# Patient Record
Sex: Female | Born: 1970 | Race: White | Hispanic: No | Marital: Married | Smoking: Current every day smoker
Health system: Southern US, Community
[De-identification: ages and names within clinical notes are randomized; demographics above are authoritative.]

## PROBLEM LIST (undated history)

## (undated) DIAGNOSIS — E119 Type 2 diabetes mellitus without complications: Secondary | ICD-10-CM

## (undated) DIAGNOSIS — F32A Depression, unspecified: Secondary | ICD-10-CM

## (undated) DIAGNOSIS — I10 Essential (primary) hypertension: Secondary | ICD-10-CM

## (undated) DIAGNOSIS — F329 Major depressive disorder, single episode, unspecified: Secondary | ICD-10-CM

## (undated) DIAGNOSIS — E079 Disorder of thyroid, unspecified: Secondary | ICD-10-CM

## (undated) HISTORY — PX: FOOT SURGERY: SHX648

## (undated) HISTORY — PX: ABDOMINAL HYSTERECTOMY: SHX81

## (undated) HISTORY — PX: HAND SURGERY: SHX662

---

## 2015-09-20 ENCOUNTER — Ambulatory Visit
Admission: EM | Admit: 2015-09-20 | Discharge: 2015-09-20 | Disposition: A | Discharge status: Home / Self Care | Payer: Worker's Compensation | Attending: Family Medicine | Admitting: Family Medicine

## 2015-09-20 ENCOUNTER — Ambulatory Visit: Payer: Worker's Compensation

## 2015-09-20 DIAGNOSIS — M5442 Lumbago with sciatica, left side: Secondary | ICD-10-CM

## 2015-09-20 DIAGNOSIS — M479 Spondylosis, unspecified: Secondary | ICD-10-CM

## 2015-09-20 DIAGNOSIS — R252 Cramp and spasm: Secondary | ICD-10-CM

## 2015-09-20 HISTORY — DX: Disorder of thyroid, unspecified: E07.9

## 2015-09-20 HISTORY — DX: Type 2 diabetes mellitus without complications: E11.9

## 2015-09-20 HISTORY — DX: Essential (primary) hypertension: I10

## 2015-09-20 HISTORY — DX: Major depressive disorder, single episode, unspecified: F32.9

## 2015-09-20 HISTORY — DX: Depression, unspecified: F32.A

## 2015-09-20 MED ORDER — PREDNISONE 10 MG (21) PO TBPK: ORAL_TABLET | ORAL | Status: AC

## 2015-09-20 MED ORDER — MELOXICAM 15 MG PO TABS: 15.0000 mg | ORAL_TABLET | Freq: Every day | ORAL | Status: DC

## 2015-09-20 MED ORDER — HYDROCODONE-ACETAMINOPHEN 5-325 MG PO TABS: 1.0000 | ORAL_TABLET | Freq: Three times a day (TID) | ORAL | Status: AC | PRN

## 2015-09-20 MED ORDER — ORPHENADRINE CITRATE ER 100 MG PO TB12: 100.0000 mg | ORAL_TABLET | Freq: Two times a day (BID) | ORAL | Status: DC

## 2015-09-20 NOTE — ED Provider Notes (Signed)
CSN: [id number]     Arrival date & time 09/20/15  1041 History   First MD Initiated Contact with Patient 09/20/15 1141    Nurses notes were reviewed. Chief Complaint  Patient presents with  . Fall  . Back Pain   patient states that she fell on 09/07/2015 out of her chair while she was trying to unplug something underneath her desk. She and on her left hand and wrist versus gotten better but about 6-7 days after this fall she started having back pain. She states she's never had back pain before. The pain is progressively gotten worse interfering with her walking and her ambulation as well. States pain goes down her left thigh and shooting sensation. (Consider location/radiation/quality/duration/timing/severity/associated sxs/prior Treatment) Patient is a 44 y.o. female presenting with fall and back pain. The history is provided by the patient. No language interpreter was used.  Fall This is a new problem. The current episode started more than 1 week ago Larey Seat(Fell on 09/07/2015 at work chair went out from under her as she was reaching to work on a plug under her desk.). The problem has been gradually worsening. Pertinent negatives include no chest pain, no abdominal pain, no headaches and no shortness of breath. The symptoms are aggravated by bending and walking. Nothing relieves the symptoms. She has tried nothing for the symptoms.  Back Pain Location:  Lumbar spine and sacro-iliac joint Quality:  Aching and stabbing Radiates to:  L posterior upper leg and L thigh Pain severity:  Moderate Onset quality:  Sudden Timing:  Constant Progression:  Worsening Chronicity:  New Context: falling   Context: not emotional stress, not jumping from heights, not lifting heavy objects, not MCA, not MVA, not occupational injury, not pedestrian accident, not physical stress and not recent illness   Relieved by:  Nothing Ineffective treatments:  None tried Associated symptoms: no abdominal pain, no chest pain and no  headaches   Risk factors: recent surgery   Risk factors: no hx of cancer     Past Medical History  Diagnosis Date  . Hypertension   . Diabetes mellitus without complication (HCC)   . Thyroid disease   . Depression    Past Surgical History  Procedure Laterality Date  . Abdominal hysterectomy    . Hand surgery    . Foot surgery     History reviewed. No pertinent family history. Social History  Substance Use Topics  . Smoking status: Current Every Day Smoker  . Smokeless tobacco: None  . Alcohol Use: Yes   OB History    No data available     Review of Systems  Respiratory: Negative for shortness of breath.   Cardiovascular: Negative for chest pain.  Gastrointestinal: Negative for abdominal pain.  Musculoskeletal: Positive for back pain.  Neurological: Negative for headaches.  All other systems reviewed and are negative.  Patient does smoke and requesting medication for pain Allergies  Penicillins; Sulfur; and Tramadol  Home Medications   Prior to Admission medications   Medication Sig Start Date End Date Taking? Authorizing Provider  aspirin 81 MG chewable tablet Chew by mouth daily.   Yes Historical Provider, MD  buPROPion (ZYBAN) 150 MG 12 hr tablet Take 150 mg by mouth 2 (two) times daily.   Yes Historical Provider, MD  citalopram (CELEXA) 40 MG tablet Take 40 mg by mouth daily.   Yes Historical Provider, MD  glyBURIDE (DIABETA) 5 MG tablet Take 5 mg by mouth daily with breakfast.   Yes Historical Provider,  MD  levothyroxine (SYNTHROID, LEVOTHROID) 150 MCG tablet Take 150 mcg by mouth daily before breakfast.   Yes Historical Provider, MD  Liraglutide (VICTOZA [place]) Inject 1.2 mg into the skin 1 day or 1 dose.   Yes Historical Provider, MD  lisinopril (PRINIVIL,ZESTRIL) 2.5 MG tablet Take 2.5 mg by mouth daily.   Yes Historical Provider, MD  meclizine (ANTIVERT) 25 MG tablet Take 25 mg by mouth 3 (three) times daily as needed for dizziness.   Yes Historical Provider,  MD  metFORMIN (GLUCOPHAGE) 500 MG tablet Take by mouth 2 (two) times daily with a meal.   Yes Historical Provider, MD  simvastatin (ZOCOR) 20 MG tablet Take 20 mg by mouth daily.   Yes Historical Provider, MD  traZODone (DESYREL) 150 MG tablet Take by mouth at bedtime.   Yes Historical Provider, MD  HYDROcodone-acetaminophen (NORCO) 5-325 MG tablet Take 1 tablet by mouth every 8 (eight) hours as needed for moderate pain. 09/20/15   Hassan Rowan, MD  meloxicam (MOBIC) 15 MG tablet Take 1 tablet (15 mg total) by mouth daily. 09/20/15   Hassan Rowan, MD  orphenadrine (NORFLEX) 100 MG tablet Take 1 tablet (100 mg total) by mouth 2 (two) times daily. 09/20/15   Hassan Rowan, MD  predniSONE (STERAPRED UNI-PAK 21 TAB) 10 MG (21) TBPK tablet Sig 6 tablet day 1, 5 tablets day 2, 4 tablets day 3,,3tablets day 4, 2 tablets day 5, 1 tablet day 6 take all tablets orally 09/20/15   Hassan Rowan, MD   Meds Ordered and Administered this Visit  Medications - No data to display  BP 118/62 mmHg  Pulse 69  Temp(Src) 98.6 F (37 C) (Tympanic)  Resp 16  Ht  (1.676 m)  Wt 240 lb (108.863 kg)  BMI 38.76 kg/m2  SpO2 100% No data found.   Physical Exam  Constitutional: She appears well-developed and well-nourished.  HENT:  Head: Normocephalic and atraumatic.  Eyes: Conjunctivae are normal. Pupils are equal, round, and reactive to light.  Neck: Normal range of motion. Neck supple.  Musculoskeletal: She exhibits tenderness.       Lumbar back: She exhibits tenderness and spasm.       Back:  Patient pain over the left iliac sacral crest and over the left lower lumbar spine.  Neurological: She is alert. She has normal strength. No cranial nerve deficit or sensory deficit. She exhibits normal muscle tone.  Reflex Scores:      Patellar reflexes are 2+ on the right side and 2+ on the left side.      Achilles reflexes are 2+ on the right side and 2+ on the left side. Skin: Skin is warm and dry. No erythema.    Psychiatric: She has a normal mood and affect. Her behavior is normal.  Vitals reviewed.   ED Course  Procedures (including critical care time)  Labs Review Labs Reviewed - No data to display  Imaging Review Dg Lumbar Spine Complete  09/20/2015  CLINICAL DATA:  Right-sided low back pain, fell 2 weeks ago EXAM: LUMBAR SPINE - COMPLETE 4+ VIEW COMPARISON:  None. FINDINGS: The lumbar vertebrae are normal alignment. Intervertebral disc spaces appear normal. No compression deformity is seen. On oblique views the facet joints are unremarkable with minimal degenerative change on the right at L4-5. The SI joints are corticated. The bowel gas pattern is nonspecific. IMPRESSION: 1. Normal alignment with normal disc spaces. 2. Minimal degenerative change of the right facet joint at L4-5. Electronically Signed  By: Dwyane Dee M.D.   On: 09/20/2015 12:59   Dg Si Joints  09/20/2015  CLINICAL DATA:  Right-sided low back pain, fell several weeks ago EXAM: BILATERAL SACROILIAC JOINTS - 3+ VIEW COMPARISON:  None. FINDINGS: The SI joints appear well corticated with no diastasis noted. The sacral foramina appear normal. No sacral fracture is seen. The pelvic rami are intact. There is synchondrosis of the left transverse process of L5 with the sacrum. IMPRESSION: Negative. Electronically Signed   By: Dwyane Dee M.D.   On: 09/20/2015 13:01     Visual Acuity Review  Right Eye Distance:   Left Eye Distance:   Bilateral Distance:    Right Eye Near:   Left Eye Near:    Bilateral Near:         MDM   1. Muscle cramps   2. 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 Will limit her to 15  pound weight limit and lifting and carrying objects at work and will follow up with her in 2 weeks for reevaluation.   Hassan Rowan, MD 09/20/15 1325

## 2015-09-20 NOTE — ED Notes (Signed)
Pt states "I fell out of my chair at work on 09/07/15. It's been about 2 weeks but the pain in my back started last week. I have taken aleve without relief."

## 2015-09-20 NOTE — Discharge Instructions (Signed)
Back Exercises °If you have pain in your back, do these exercises 2-3 times each day or as told by your doctor. When the pain goes away, do the exercises once each day, but repeat the steps more times for each exercise (do more repetitions). If you do not have pain in your back, do these exercises once each day or as told by your doctor. °EXERCISES °Single Knee to Chest °Do these steps 3-5 times in a row for each leg: °· Lie on your back on a firm bed or the floor with your legs stretched out. °· Bring one knee to your chest. °· Hold your knee to your chest by grabbing your knee or thigh. °· Pull on your knee until you feel a gentle stretch in your lower back. °· Keep doing the stretch for 10-30 seconds. °· Slowly let go of your leg and straighten it. °Pelvic Tilt °Do these steps 5-10 times in a row: °· Lie on your back on a firm bed or the floor with your legs stretched out. °· Bend your knees so they point up to the ceiling. Your feet should be flat on the floor. °· Tighten your lower belly (abdomen) muscles to press your lower back against the floor. This will make your tailbone point up to the ceiling instead of pointing down to your feet or the floor. °· Stay in this position for 5-10 seconds while you gently tighten your muscles and breathe evenly. °Cat-Cow °Do these steps until your lower back bends more easily: °· Get on your hands and knees on a firm surface. Keep your hands under your shoulders, and keep your knees under your hips. You may put padding under your knees. °· Let your head hang down, and make your tailbone point down to the floor so your lower back is round like the back of a cat. °· Stay in this position for 5 seconds. °· Slowly lift your head and make your tailbone point up to the ceiling so your back hangs low (sags) like the back of a cow. °· Stay in this position for 5 seconds. °Press-Ups °Do these steps 5-10 times in a row: °1. Lie on your belly (face-down) on the floor. °2. Place your  hands near your head, about shoulder-width apart. °3. While you keep your back relaxed and keep your hips on the floor, slowly straighten your arms to raise the top half of your body and lift your shoulders. Do not use your back muscles. To make yourself more comfortable, you may change where you place your hands. °4. Stay in this position for 5 seconds. °5. Slowly return to lying flat on the floor. °Bridges °Do these steps 10 times in a row: °1. Lie on your back on a firm surface. °2. Bend your knees so they point up to the ceiling. Your feet should be flat on the floor. °3. Tighten your butt muscles and lift your butt off of the floor until your waist is almost as high as your knees. If you do not feel the muscles working in your butt and the back of your thighs, slide your feet 1-2 inches farther away from your butt. °4. Stay in this position for 3-5 seconds. °5. Slowly lower your butt to the floor, and let your butt muscles relax. °If this exercise is too easy, try doing it with your arms crossed over your chest. °Belly Crunches °Do these steps 5-10 times in a row: °1. Lie on your back on a firm bed   or the floor with your legs stretched out. °2. Bend your knees so they point up to the ceiling. Your feet should be flat on the floor. °3. Cross your arms over your chest. °4. Tip your chin a little bit toward your chest but do not bend your neck. °5. Tighten your belly muscles and slowly raise your chest just enough to lift your shoulder blades a tiny bit off of the floor. °6. Slowly lower your chest and your head to the floor. °Back Lifts °Do these steps 5-10 times in a row: °1. Lie on your belly (face-down) with your arms at your sides, and rest your forehead on the floor. °2. Tighten the muscles in your legs and your butt. °3. Slowly lift your chest off of the floor while you keep your hips on the floor. Keep the back of your head in line with the curve in your back. Look at the floor while you do this. °4. Stay  in this position for 3-5 seconds. °5. Slowly lower your chest and your face to the floor. °GET HELP IF: °· Your back pain gets a lot worse when you do an exercise. °· Your back pain does not lessen 2 hours after you exercise. °If you have any of these problems, stop doing the exercises. Do not do them again unless your doctor says it is okay. °GET HELP RIGHT AWAY IF: °· You have sudden, very bad back pain. If this happens, stop doing the exercises. Do not do them again unless your doctor says it is okay. °  °This information is not intended to replace advice given to you by your health care provider. Make sure you discuss any questions you have with your health care provider. °  °Document Released: 12/27/2010 Document Revised: 08/15/2015 Document Reviewed: 01/18/2015 °[employer]vier Interactive Patient Education ©2016 [employer] ° °Muscle Cramps and Spasms °Muscle cramps and spasms are when muscles tighten by themselves. They usually get better within minutes. Muscle cramps are painful. They are usually stronger and last longer than muscle spasms. Muscle spasms may or may not be painful. They can last a few seconds or much longer. °HOME CARE °· Drink enough fluid to keep your pee (urine) clear or pale yellow. °· Massage, stretch, and relax the muscle. °· Use a warm towel, heating pad, or warm shower water on tight muscles. °· Place ice on the muscle if it is tender or in pain. °· Put ice in a plastic bag. °· Place a towel between your skin and the bag. °· Leave the ice on for 15-20 minutes, 03-04 times a day. °· Only take medicine as told by your doctor. °GET HELP RIGHT AWAY IF:  °Your cramps or spasms get worse, happen more often, or do not get better with time. °MAKE SURE YOU: °· Understand these instructions. °· Will watch your condition. °· Will get help right away if you are not doing well or get worse. °  °This information is not intended to replace advice given to you by your health care provider. Make sure you  discuss any questions you have with your health care provider. °  °Document Released: 11/06/2008 Document Revised: 03/21/2013 Document Reviewed: 11/10/2012 °[employer]vier Interactive Patient Education ©2016 [employer] ° °Sciatica °Sciatica is pain, weakness, numbness, or tingling along your sciatic nerve. The nerve starts in the lower back and runs down the back of each leg. Nerve damage or certain conditions pinch or put pressure on the sciatic nerve. This causes the pain, weakness, and   other discomforts of sciatica. °HOME CARE  °· Only take medicine as told by your doctor. °· Apply ice to the affected area for 20 minutes. Do this 3-4 times a day for the first 48-72 hours. Then try heat in the same way. °· Exercise, stretch, or do your usual activities if these do not make your pain worse. °· Go to physical therapy as told by your doctor. °· Keep all doctor visits as told. °· Do not wear high heels or shoes that are not supportive. °· Get a firm mattress if your mattress is too soft to lessen pain and discomfort. °GET HELP RIGHT AWAY IF:  °· You cannot control when you poop (bowel movement) or pee (urinate). °· You have more weakness in your lower back, lower belly (pelvis), butt (buttocks), or legs. °· You have redness or puffiness (swelling) of your back. °· You have a burning feeling when you pee. °· You have pain that gets worse when you lie down. °· You have pain that wakes you from your sleep. °· Your pain is worse than past pain. °· Your pain lasts longer than 4 weeks. °· You are suddenly losing weight without reason. °MAKE SURE YOU:  °· Understand these instructions. °· Will watch this condition. °· Will get help right away if you are not doing well or get worse. °  °This information is not intended to replace advice given to you by your health care provider. Make sure you discuss any questions you have with your health care provider. °  °Document Released: 09/02/2008 Document Revised: 08/15/2015 Document  Reviewed: 04/04/2012 °[employer]vier Interactive Patient Education ©2016 [employer] ° °

## 2015-10-05 ENCOUNTER — Encounter: Payer: Self-pay | Admitting: Emergency Medicine

## 2015-10-05 ENCOUNTER — Ambulatory Visit
Admission: EM | Admit: 2015-10-05 | Discharge: 2015-10-05 | Disposition: A | Discharge status: Home / Self Care | Payer: Worker's Compensation | Attending: Family Medicine | Admitting: Family Medicine

## 2015-10-05 DIAGNOSIS — M5442 Lumbago with sciatica, left side: Secondary | ICD-10-CM

## 2015-10-05 DIAGNOSIS — M62830 Muscle spasm of back: Secondary | ICD-10-CM | POA: Diagnosis not present

## 2015-10-05 MED ORDER — ORPHENADRINE CITRATE ER 100 MG PO TB12: 100.0000 mg | ORAL_TABLET | Freq: Two times a day (BID) | ORAL | Status: AC

## 2015-10-05 MED ORDER — MELOXICAM 15 MG PO TABS: 15.0000 mg | ORAL_TABLET | Freq: Every day | ORAL | Status: AC

## 2015-10-05 NOTE — Discharge Instructions (Signed)
Muscle Cramps and Spasms Muscle cramps and spasms occur when a muscle or muscles tighten and you have no control over this tightening (involuntary muscle contraction). They are a common problem and can develop in any muscle. The most common place is in the calf muscles of the leg. Both muscle cramps and muscle spasms are involuntary muscle contractions, but they also have differences:   Muscle cramps are sporadic and painful. They may last a few seconds to a quarter of an hour. Muscle cramps are often more forceful and last longer than muscle spasms.  Muscle spasms may or may not be painful. They may also last just a few seconds or much longer. CAUSES  It is uncommon for cramps or spasms to be due to a serious underlying problem. In many cases, the cause of cramps or spasms is unknown. Some common causes are:   Overexertion.   Overuse from repetitive motions (doing the same thing over and over).   Remaining in a certain position for a long period of time.   Improper preparation, form, or technique while performing a sport or activity.   Dehydration.   Injury.   Side effects of some medicines.   Abnormally low levels of the salts and ions in your blood (electrolytes), especially potassium and calcium. This could happen if you are taking water pills (diuretics) or you are pregnant.  Some underlying medical problems can make it more likely to develop cramps or spasms. These include, but are not limited to:   Diabetes.   Parkinson disease.   Hormone disorders, such as thyroid problems.   Alcohol abuse.   Diseases specific to muscles, joints, and bones.   Blood vessel disease where not enough blood is getting to the muscles.  HOME CARE INSTRUCTIONS   Stay well hydrated. Drink enough water and fluids to keep your urine clear or pale yellow.  It may be helpful to massage, stretch, and relax the affected muscle.  For tight or tense muscles, use a warm towel, heating  pad, or hot shower water directed to the affected area.  If you are sore or have pain after a cramp or spasm, applying ice to the affected area may relieve discomfort.  Put ice in a plastic bag.  Place a towel between your skin and the bag.  Leave the ice on for 15-20 minutes, 03-04 times a day.  Medicines used to treat a known cause of cramps or spasms may help reduce their frequency or severity. Only take over-the-counter or prescription medicines as directed by your caregiver. SEEK MEDICAL CARE IF:  Your cramps or spasms get more severe, more frequent, or do not improve over time.  MAKE SURE YOU:   Understand these instructions.  Will watch your condition.  Will get help right away if you are not doing well or get worse.   This information is not intended to replace advice given to you by your health care provider. Make sure you discuss any questions you have with your health care provider.   Document Released: 05/16/2002 Document Revised: 03/21/2013 Document Reviewed: 11/10/2012 Elsevier Interactive Patient Education 2016 [place] Injury Prevention Back injuries can be very painful. They can also be difficult to heal. After having one back injury, you are more likely to injure your back again. It is important to learn how to avoid injuring or re-injuring your back. The following tips can help you to prevent a back injury. WHAT SHOULD I KNOW ABOUT PHYSICAL FITNESS?  Exercise for  30 minutes per day on most days of the week or as told by your doctor. Make sure to:  Do aerobic exercises, such as walking, jogging, biking, or swimming.  Do exercises that increase balance and strength, such as tai chi and yoga.  Do stretching exercises. This helps with flexibility.  Try to develop strong belly (abdominal) muscles. Your belly muscles help to support your back.  Stay at a healthy weight. This helps to decrease your risk of a back injury. WHAT SHOULD I KNOW ABOUT MY  DIET?  Talk with your doctor about your overall diet. Take supplements and vitamins only as told by your doctor.  Talk with your doctor about how much calcium and vitamin D you need each day. These nutrients help to prevent weakening of the bones (osteoporosis).  Include good sources of calcium in your diet, such as:  Dairy products.  Green leafy vegetables.  Products that have had calcium added to them (fortified).  Include good sources of vitamin D in your diet, such as:  Milk.  Foods that have had vitamin D added to them. WHAT SHOULD I KNOW ABOUT MY POSTURE?  Sit up straight and stand up straight. Avoid leaning forward when you sit or hunching over when you stand.  Choose chairs that have good low-back (lumbar) support.  If you work at a desk, sit close to it so you do not need to lean over. Keep your chin tucked in. Keep your neck drawn back. Keep your elbows bent so your arms look like the letter "L" (right angle).  Sit high and close to the steering wheel when you drive. Add a low-back support to your car seat, if needed.  Avoid sitting or standing in one position for very long. Take breaks to get up, stretch, and walk around at least one time every hour. Take breaks every hour if you are driving for long periods of time.  Sleep on your side with your knees slightly bent, or sleep on your back with a pillow under your knees. Do not lie on the front of your body to sleep. WHAT SHOULD I KNOW ABOUT LIFTING, TWISTING, AND REACHING Lifting and Heavy Lifting  Avoid heavy lifting, especially lifting over and over again. If you must do heavy lifting:  Stretch before lifting.  Work slowly.  Rest between lifts.  Use a tool such as a cart or a dolly to move objects if one is available.  Make several small trips instead of carrying one heavy load.  Ask for help when you need it, especially when moving big objects.  Follow these steps when lifting:  Stand with your feet  shoulder-width apart.  Get as close to the object as you can. Do not pick up a heavy object that is far from your body.  Use handles or lifting straps if they are available.  Bend at your knees. Squat down, but keep your heels off the floor.  Keep your shoulders back. Keep your chin tucked in. Keep your back straight.  Lift the object slowly while you tighten the muscles in your legs, belly, and butt. Keep the object as close to the center of your body as possible.  Follow these steps when putting down a heavy load:  Stand with your feet shoulder-width apart.  Lower the object slowly while you tighten the muscles in your legs, belly, and butt. Keep the object as close to the center of your body as possible.  Keep your shoulders back. Keep your  chin tucked in. Keep your back straight.  Bend at your knees. Squat down, but keep your heels off the floor.  Use handles or lifting straps if they are available. Twisting and Reaching  Avoid lifting heavy objects above your waist.  Do not twist at your waist while you are lifting or carrying a load. If you need to turn, move your feet.  Do not bend over without bending at your knees.  Avoid reaching over your head, across a table, or for an object on a high surface.  WHAT ARE SOME OTHER TIPS?  Avoid wet floors and icy ground. Keep sidewalks clear of ice to prevent falls.   Do not sleep on a mattress that is too soft or too hard.   Keep items that you use often within easy reach.   Put heavier objects on shelves at waist level, and put lighter objects on lower or higher shelves.  Find ways to lower your stress, such as:  Exercise.  Massage.  Relaxation techniques.  Talk with your doctor if you feel anxious or depressed. These conditions can make back pain worse.  Wear flat heel shoes with cushioned soles.  Avoid making quick (sudden) movements.  Use both shoulder straps when carrying a backpack.  Do not use any  tobacco products, including cigarettes, chewing tobacco, or electronic cigarettes. If you need help quitting, ask your doctor.   This information is not intended to replace advice given to you by your health care provider. Make sure you discuss any questions you have with your health care provider.   Document Released: 05/12/2008 Document Revised: 04/10/2015 Document Reviewed: 11/28/2014 Elsevier Interactive Patient Education [employer].

## 2015-10-05 NOTE — ED Provider Notes (Signed)
CSN: [id number]     Arrival date & time 10/05/15  1258 History   None   Nurses notes were reviewed. Chief Complaint  Patient presents with  . Back Pain  . Worker's Comp Foloow-up Visit    patient reports overall things doing much better. The pain and discomfort that she was having this pain was gone. She reports some pain when she stands cooking at home for long period or if she is moving some things at work but usually she is can pace herself and she does well. She states that the Mobic and Norflex and really felt back. (Consider location/radiation/quality/duration/timing/severity/associated sxs/prior Treatment) Patient is a 44 y.o. female presenting with back pain. The history is provided by the patient. No language interpreter was used.  Back Pain Location:  Sacro-iliac joint Quality:  Unable to specify Pain severity:  Mild Progression:  Improving Chronicity:  New Context: occupational injury   Context: not emotional stress, not jumping from heights, not lifting heavy objects, not pedestrian accident, not physical stress and not recent illness   Relieved by:  Muscle relaxants and NSAIDs Worsened by:  Twisting and standing Ineffective treatments:  NSAIDs Associated symptoms: no chest pain, no dysuria and no perianal numbness     Past Medical History  Diagnosis Date  . Hypertension   . Diabetes mellitus without complication (HCC)   . Thyroid disease   . Depression    Past Surgical History  Procedure Laterality Date  . Abdominal hysterectomy    . Hand surgery    . Foot surgery     History reviewed. No pertinent family history. Social History  Substance Use Topics  . Smoking status: Current Every Day Smoker  . Smokeless tobacco: None  . Alcohol Use: Yes   OB History    No data available     Review of Systems  Cardiovascular: Negative for chest pain.  Genitourinary: Negative for dysuria.  Musculoskeletal: Positive for back pain.  All other systems reviewed and are  negative.   Allergies  Penicillins; Sulfur; and Tramadol  Home Medications   Prior to Admission medications   Medication Sig Start Date End Date Taking? Authorizing Provider  aspirin 81 MG chewable tablet Chew by mouth daily.    Historical Provider, MD  buPROPion (ZYBAN) 150 MG 12 hr tablet Take 150 mg by mouth 2 (two) times daily.    Historical Provider, MD  citalopram (CELEXA) 40 MG tablet Take 40 mg by mouth daily.    Historical Provider, MD  glyBURIDE (DIABETA) 5 MG tablet Take 5 mg by mouth daily with breakfast.    Historical Provider, MD  HYDROcodone-acetaminophen (NORCO) 5-325 MG tablet Take 1 tablet by mouth every 8 (eight) hours as needed for moderate pain. 09/20/15   Hassan Rowan, MD  levothyroxine (SYNTHROID, LEVOTHROID) 150 MCG tablet Take 150 mcg by mouth daily before breakfast.    Historical Provider, MD  Liraglutide (VICTOZA [place]) Inject 1.2 mg into the skin 1 day or 1 dose.    Historical Provider, MD  lisinopril (PRINIVIL,ZESTRIL) 2.5 MG tablet Take 2.5 mg by mouth daily.    Historical Provider, MD  meclizine (ANTIVERT) 25 MG tablet Take 25 mg by mouth 3 (three) times daily as needed for dizziness.    Historical Provider, MD  meloxicam (MOBIC) 15 MG tablet Take 1 tablet (15 mg total) by mouth daily. 10/05/15   Hassan Rowan, MD  metFORMIN (GLUCOPHAGE) 500 MG tablet Take by mouth 2 (two) times daily with a meal.    Historical  Provider, MD  orphenadrine (NORFLEX) 100 MG tablet Take 1 tablet (100 mg total) by mouth 2 (two) times daily. 10/05/15   Hassan RowanEugene Fatumata Kashani, MD  predniSONE (STERAPRED UNI-PAK 21 TAB) 10 MG (21) TBPK tablet Sig 6 tablet day 1, 5 tablets day 2, 4 tablets day 3,,3tablets day 4, 2 tablets day 5, 1 tablet day 6 take all tablets orally 09/20/15   Hassan RowanEugene Jontavius Rabalais, MD  simvastatin (ZOCOR) 20 MG tablet Take 20 mg by mouth daily.    Historical Provider, MD  traZODone (DESYREL) 150 MG tablet Take by mouth at bedtime.    Historical Provider, MD   Meds Ordered and Administered this  Visit  Medications - No data to display  BP 117/56 mmHg  Pulse 77  Temp(Src) 97.4 F (36.3 C) (Tympanic)  Resp 16  Ht 5\' 7"  (1.702 m)  Wt 240 lb (108.863 kg)  BMI 37.58 kg/m2  SpO2 95% No data found.   Physical Exam  Constitutional: She is oriented to person, place, and time. She appears well-developed and well-nourished.  HENT:  Head: Normocephalic and atraumatic.  Eyes: Conjunctivae are normal. Pupils are equal, round, and reactive to light.  Musculoskeletal: Normal range of motion. She exhibits no edema or tenderness.  Neurological: She is alert and oriented to person, place, and time.  Skin: Skin is warm and dry.  Psychiatric: She has a normal mood and affect. Her behavior is normal.  Vitals reviewed.   ED Course  Procedures (including critical care time)  Labs Review Labs Reviewed - No data to display  Imaging Review No results found.   Visual Acuity Review  Right Eye Distance:   Left Eye Distance:   Bilateral Distance:    Right Eye Near:   Left Eye Near:    Bilateral Near:         MDM   1. Left-sided low back pain with left-sided sciatica   2. Muscle spasm of back      We will allow her to return full duty at this time return for office follow-up when necessary. Explained to her that she can open claim to my knowledge these 2 years after the incident without any trouble. Will renew her Mobic and Norflex to use if she needs it  Hassan RowanEugene Jeanette Moffatt, MD 10/05/15 [phone]

## 2015-10-05 NOTE — ED Notes (Signed)
Patient states that her back pain has improved and that occasionally when she is standing or walking fill a pain in her back and th pain will shoot down her leg.

## 2016-07-06 IMAGING — CR DG SI JOINTS 3+V
3 series · 3 of 3 positions shown · non-contrast
Comparison: None.

CLINICAL DATA: Right-sided low back pain, fell several weeks ago

EXAM:
BILATERAL SACROILIAC JOINTS - 3+ VIEW

[pelvis ap]
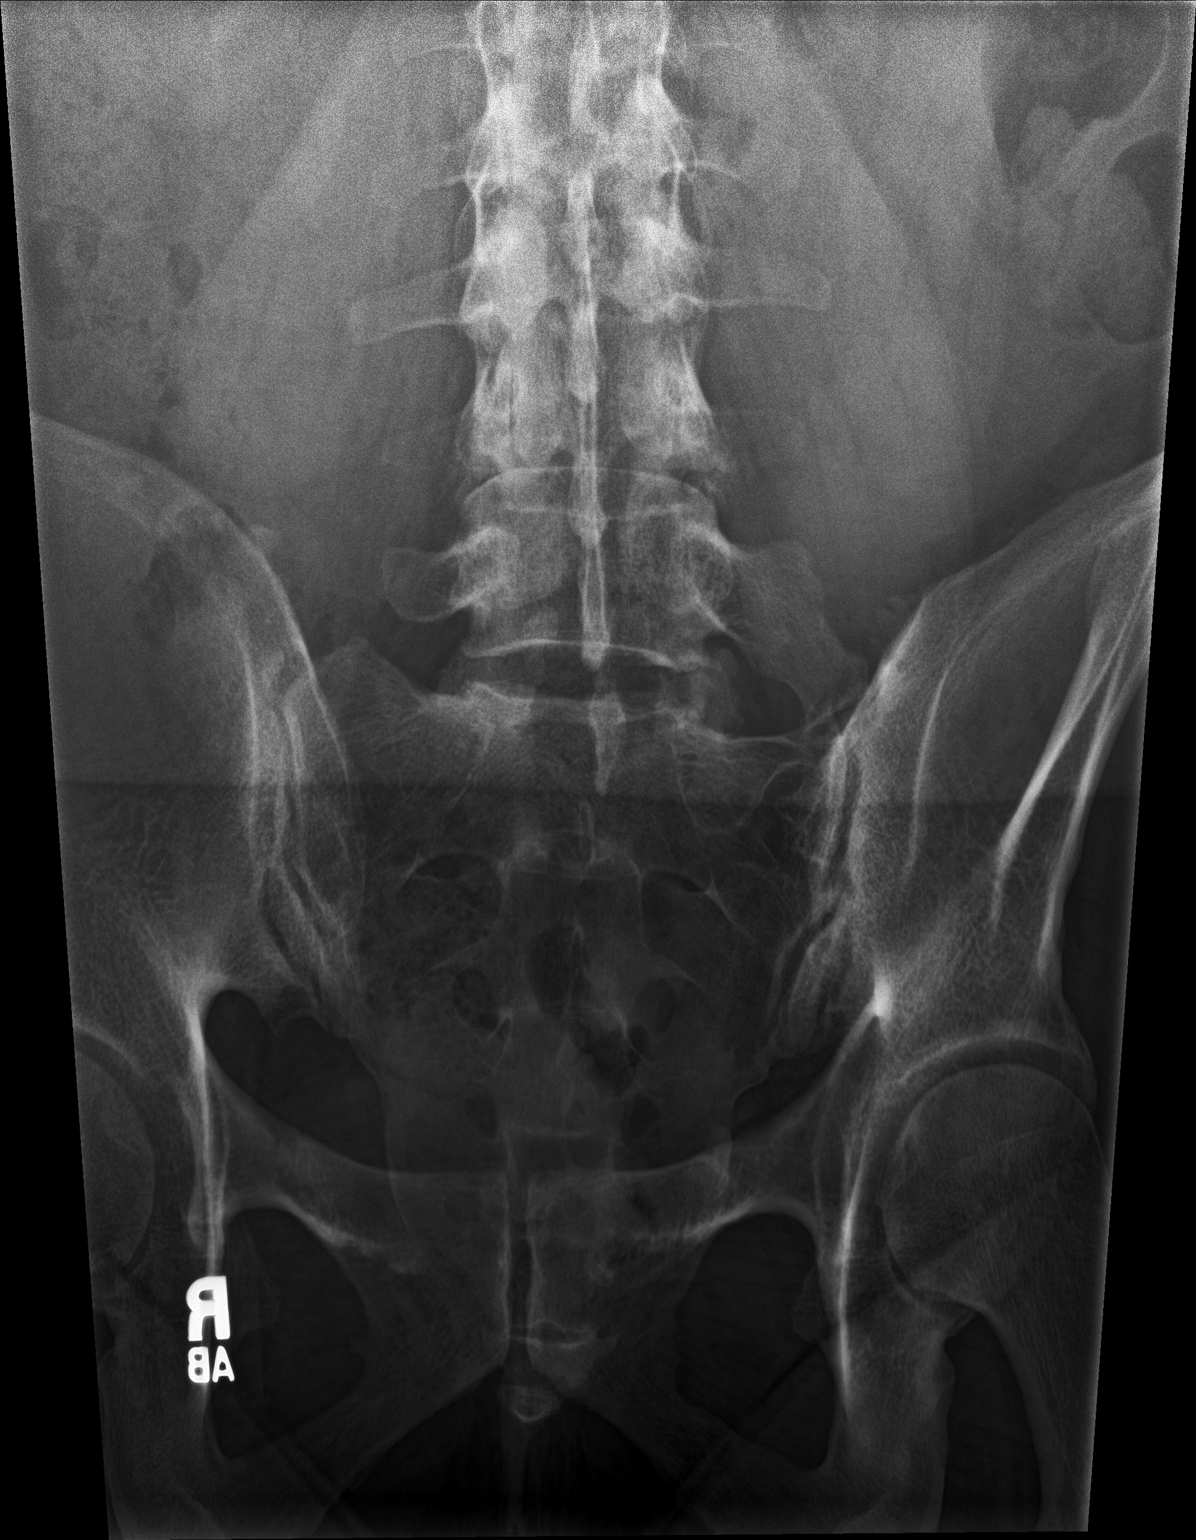

[si joints obli (1 of 2)]
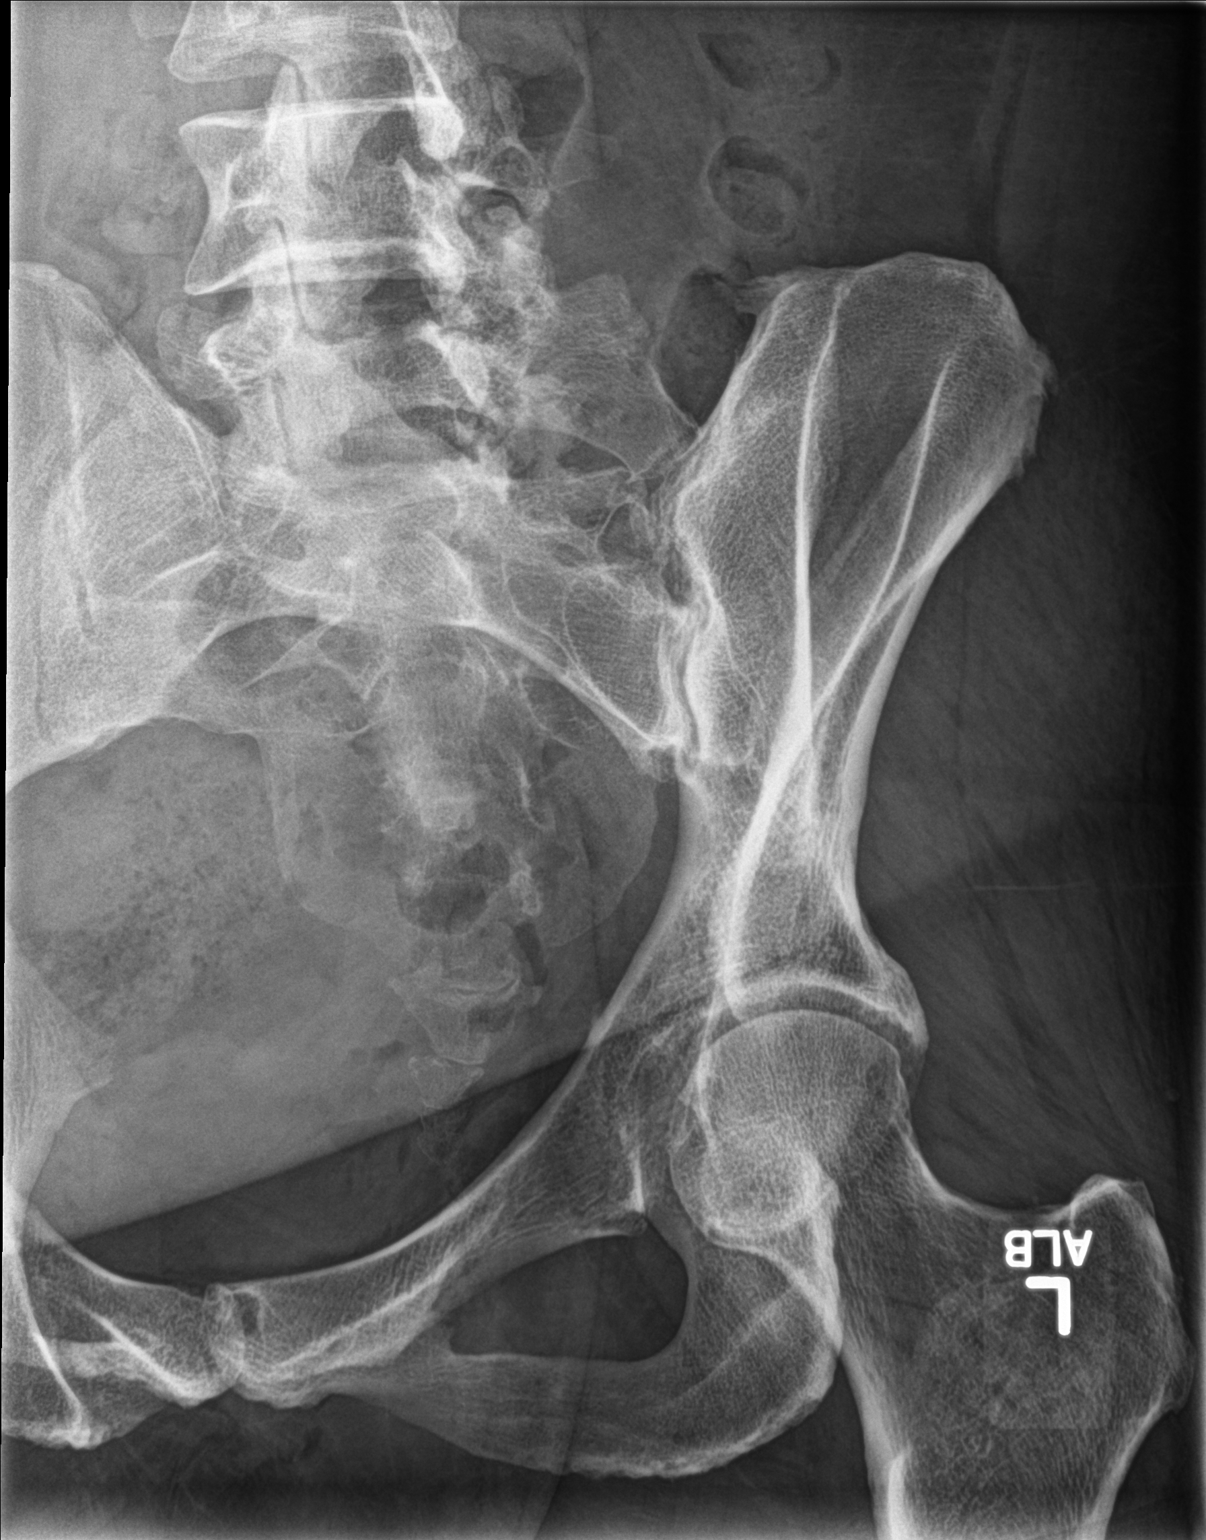

[si joints obli (2 of 2)]
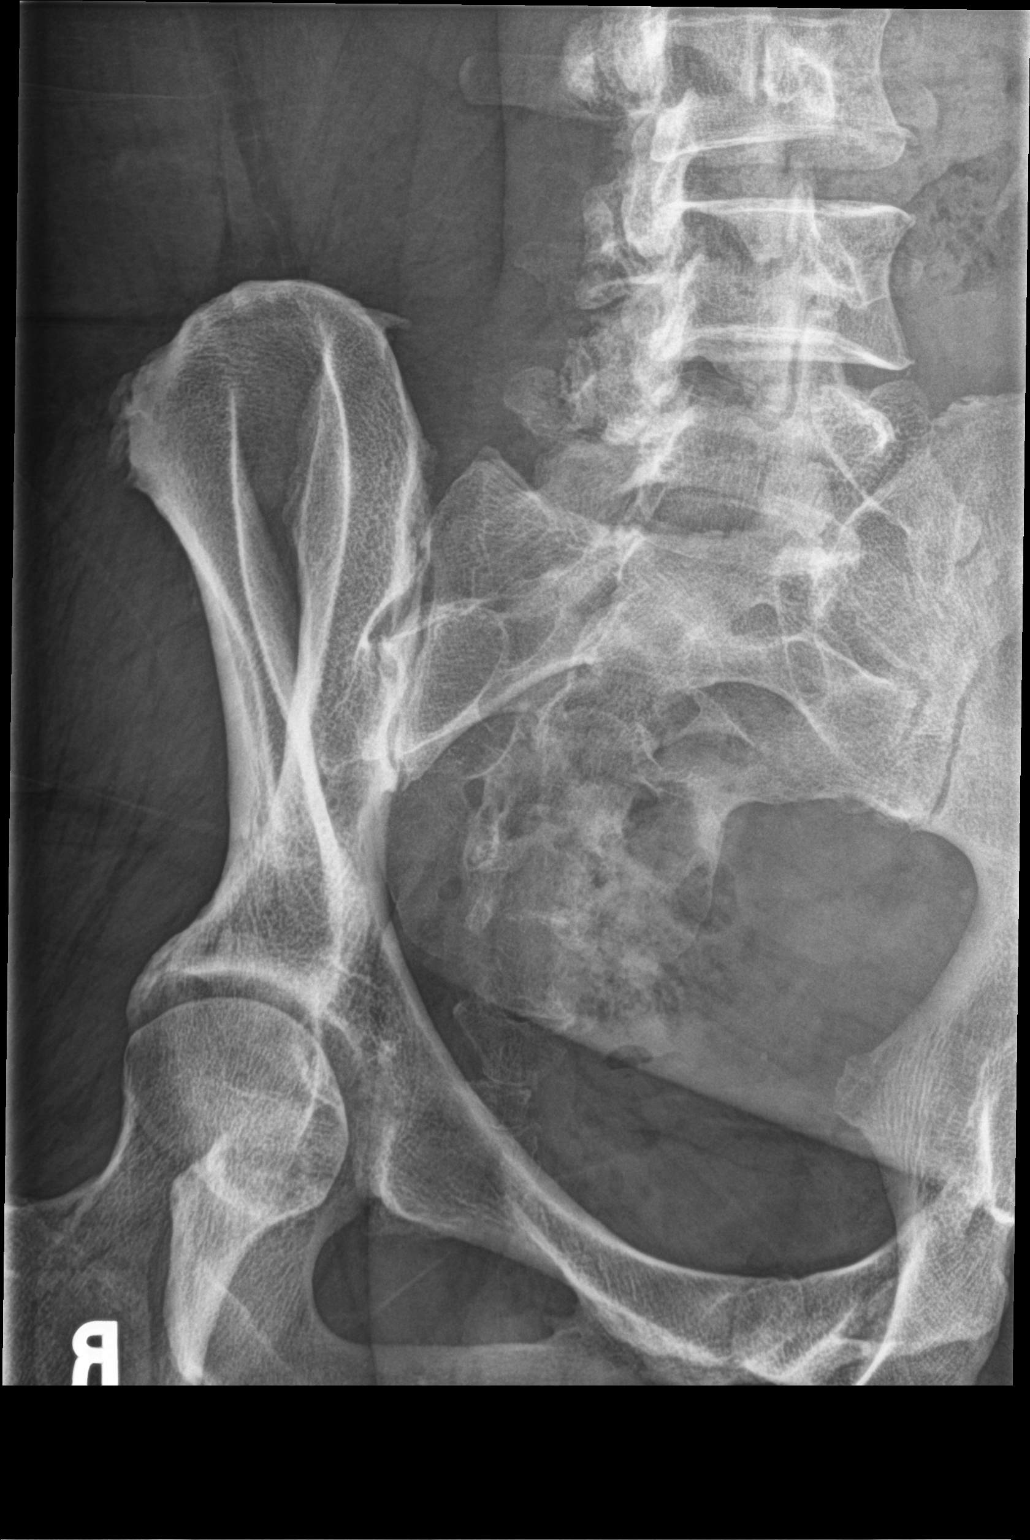

[3 of 3 positions shown; findings below may reference images not displayed]

FINDINGS: The SI joints appear well corticated with no diastasis noted. The
sacral foramina appear normal. No sacral fracture is seen. The
pelvic rami are intact. There is synchondrosis of the left
transverse process of L5 with the sacrum.
IMPRESSION: Negative.

## 2016-07-06 IMAGING — CR DG LUMBAR SPINE COMPLETE 4+V
5 series · 5 of 5 positions shown · non-contrast
Comparison: None.

CLINICAL DATA: Right-sided low back pain, fell 2 weeks ago

EXAM:
LUMBAR SPINE - COMPLETE 4+ VIEW

[l-spine ap]
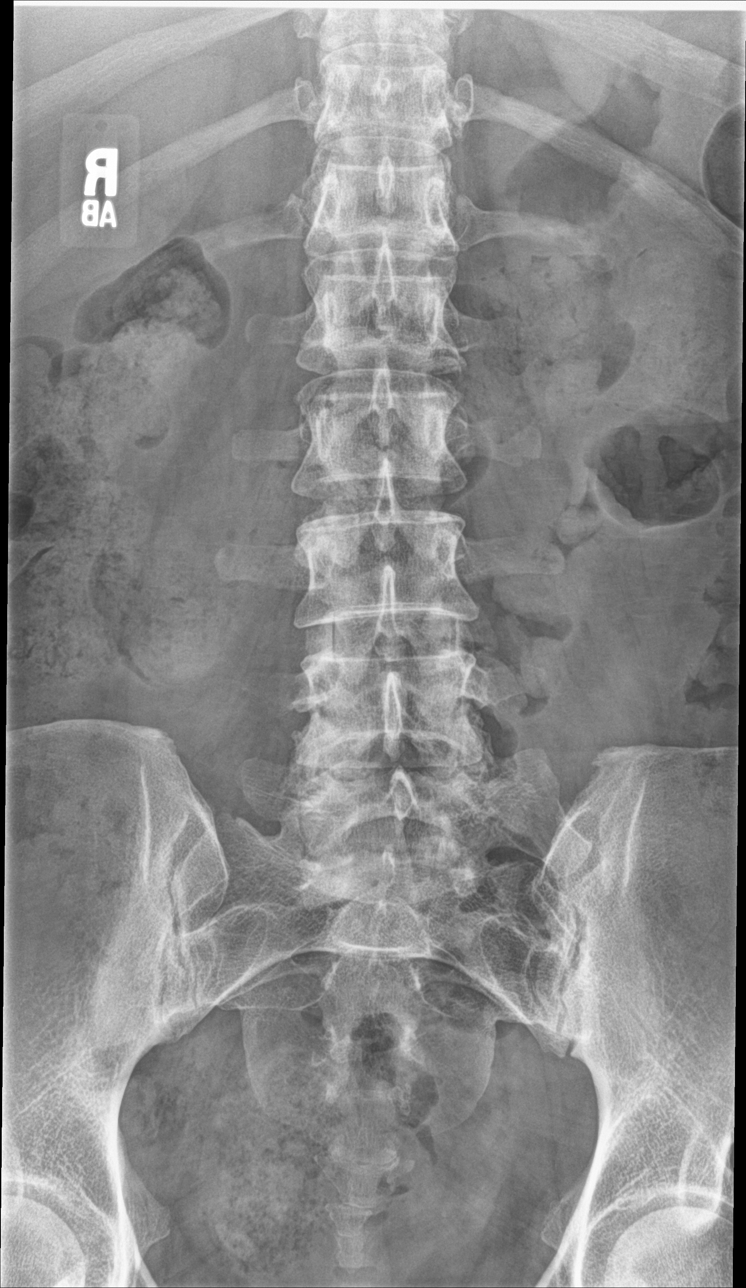

[l-spine obl (1 of 2)]
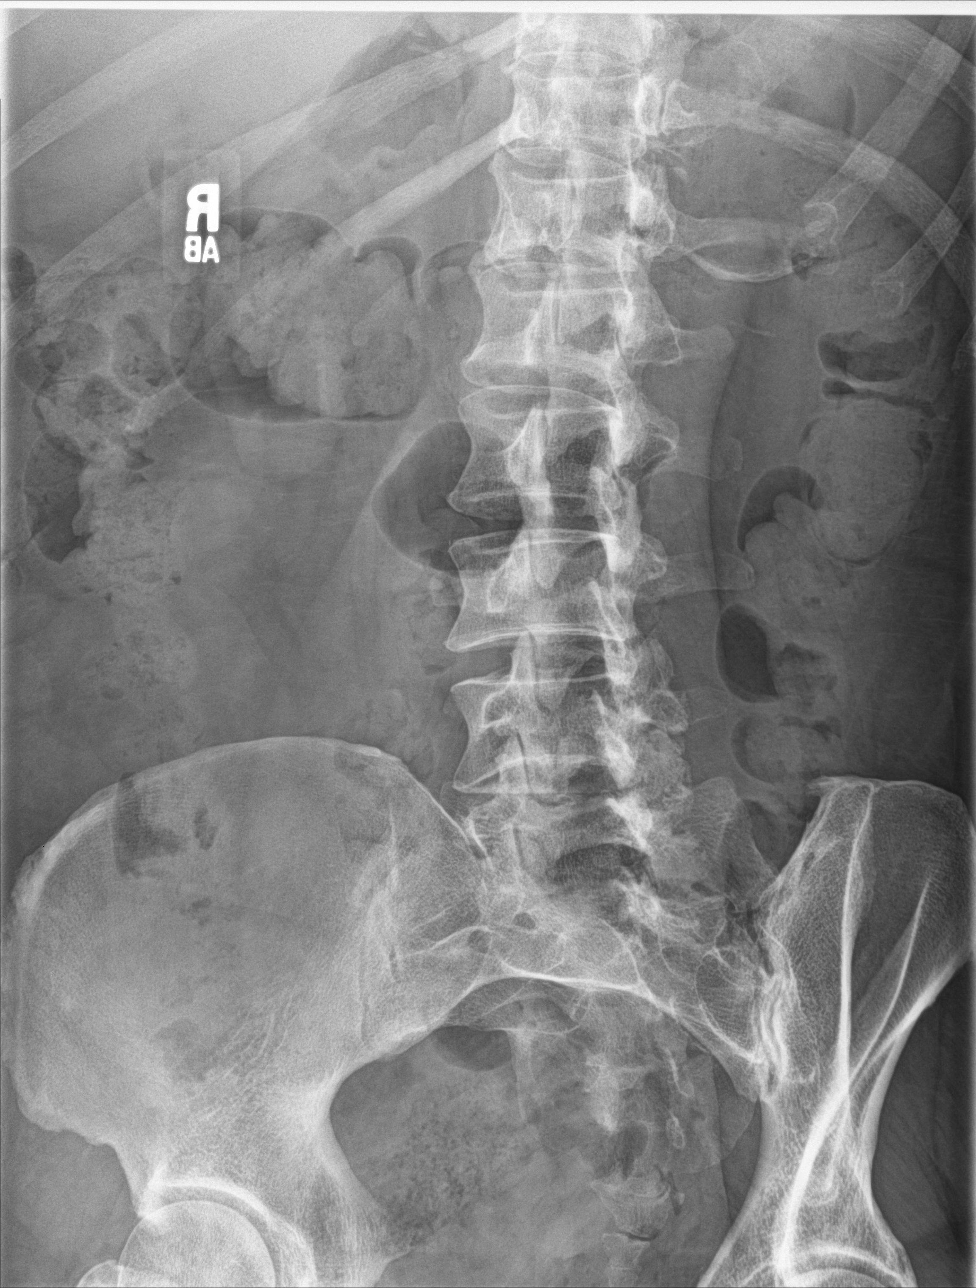

[l-spine obl (2 of 2)]
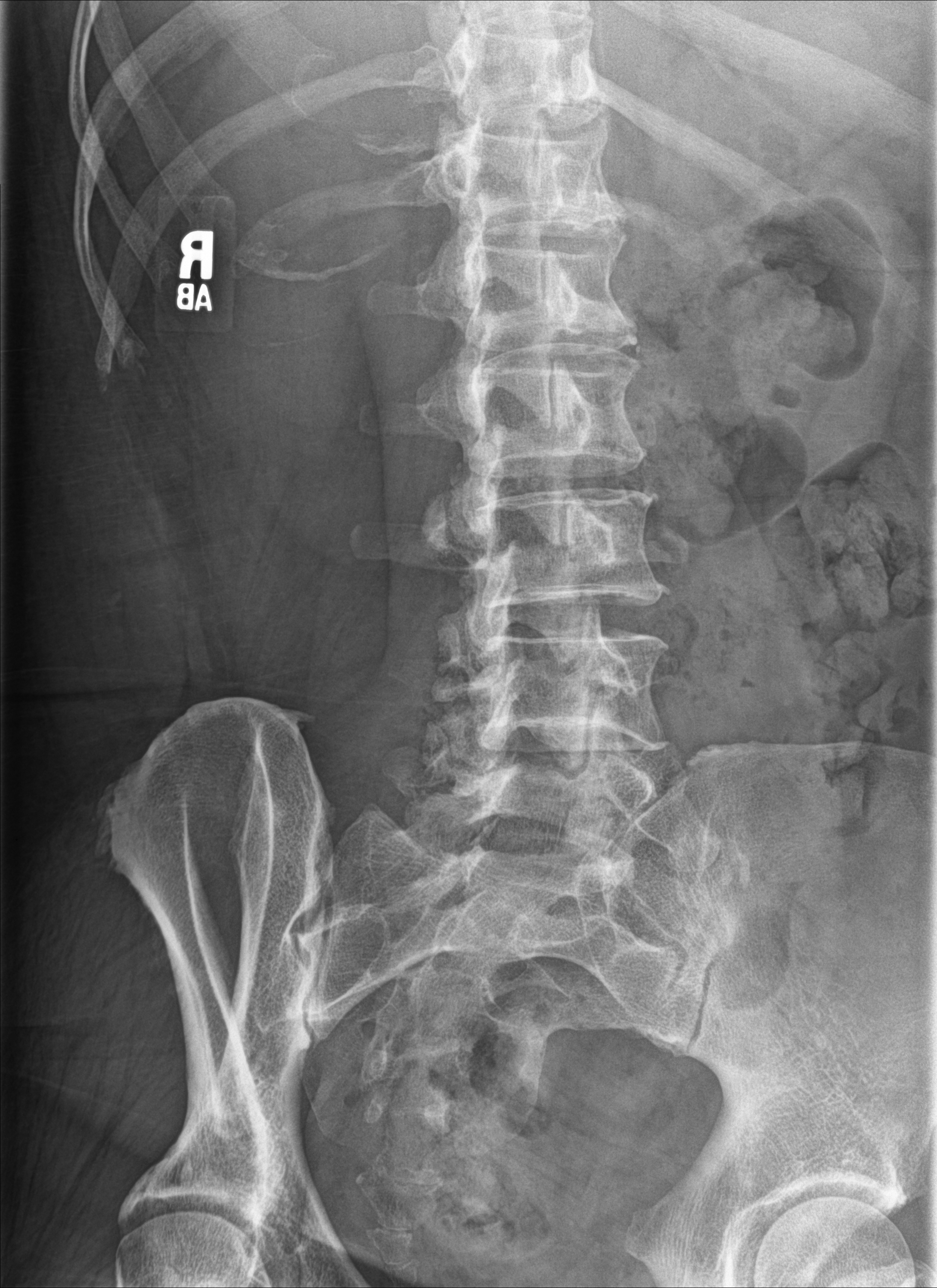

[l-spine lat]
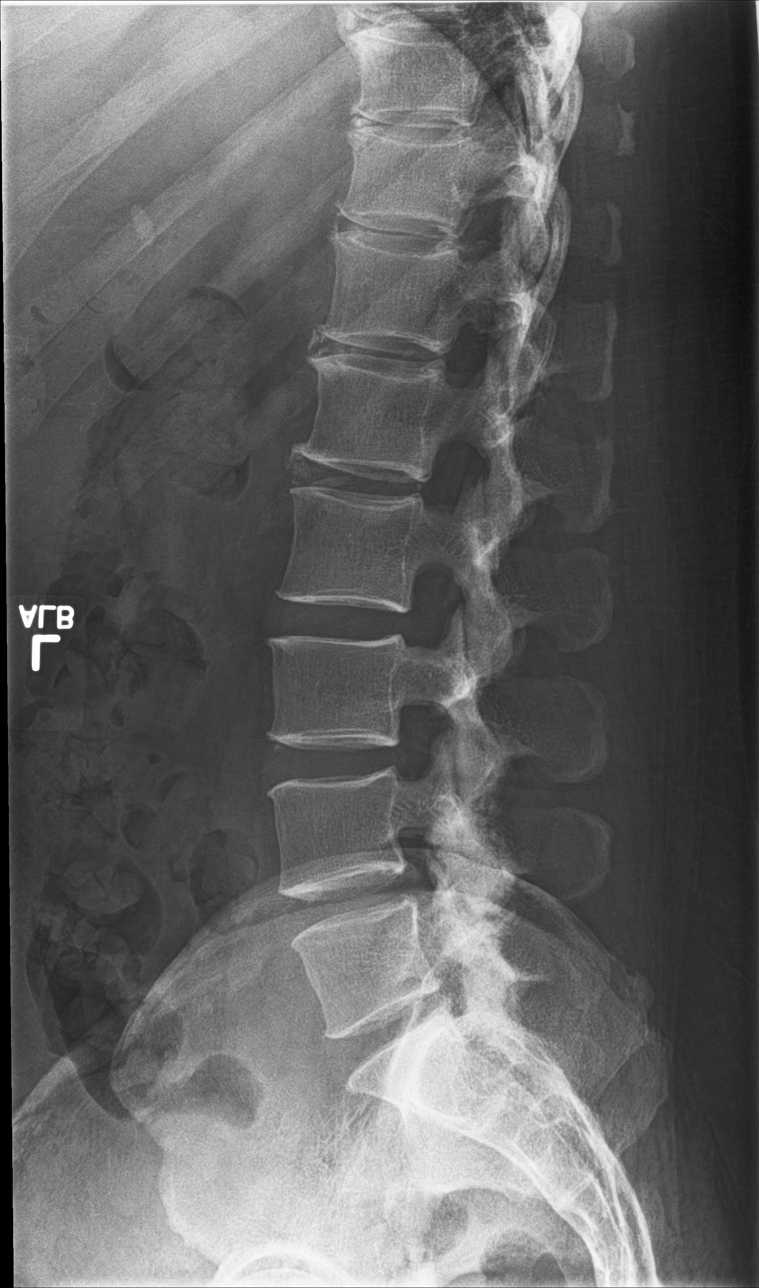

[l-spine spot]
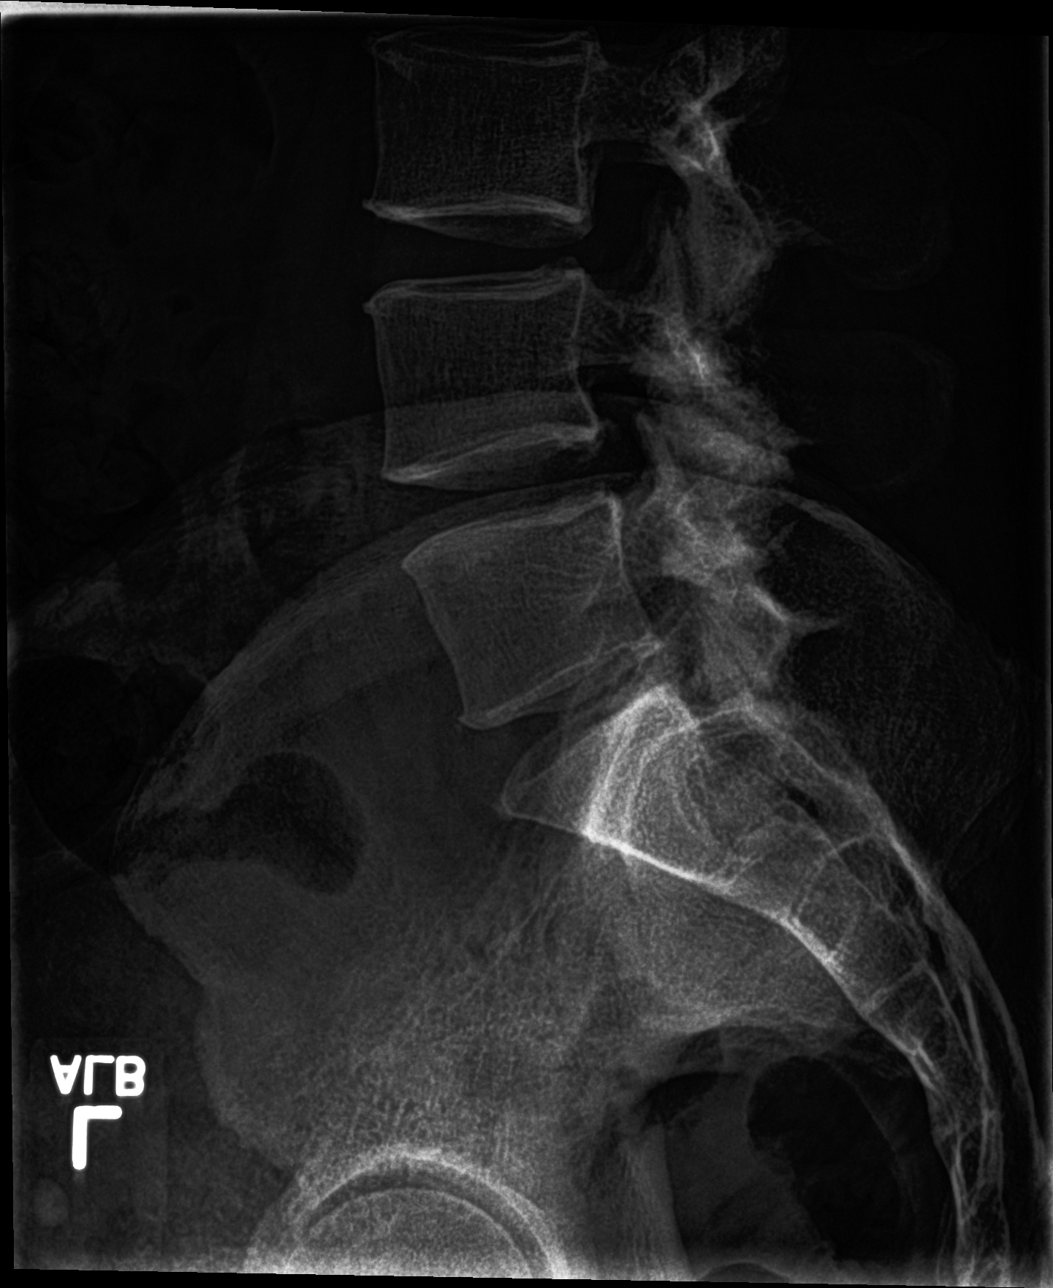

[5 of 5 positions shown; findings below may reference images not displayed]

FINDINGS: The lumbar vertebrae are normal alignment. Intervertebral disc
spaces appear normal. No compression deformity is seen. On oblique
views the facet joints are unremarkable with minimal degenerative
change on the right at L4-5. The SI joints are corticated. The bowel
gas pattern is nonspecific.
IMPRESSION: 1. Normal alignment with normal disc spaces.
2. Minimal degenerative change of the right facet joint at L4-5.
